# Patient Record
Sex: Female | Born: 1965 | Race: Black or African American | Hispanic: No | Marital: Single | Smoking: Never smoker
Health system: Southern US, Community
[De-identification: ages and names within clinical notes are randomized; demographics above are authoritative.]

## PROBLEM LIST (undated history)

## (undated) DIAGNOSIS — E119 Type 2 diabetes mellitus without complications: Secondary | ICD-10-CM

## (undated) HISTORY — PX: ABDOMINAL HYSTERECTOMY: SHX81

## (undated) HISTORY — PX: BACK SURGERY: SHX140

---

## 2004-02-03 ENCOUNTER — Other Ambulatory Visit: Payer: Self-pay

## 2005-07-06 ENCOUNTER — Emergency Department: Payer: Self-pay | Admitting: Emergency Medicine

## 2006-08-28 ENCOUNTER — Emergency Department: Payer: Self-pay | Admitting: Emergency Medicine

## 2007-05-26 ENCOUNTER — Emergency Department: Payer: Self-pay

## 2007-08-16 ENCOUNTER — Ambulatory Visit: Payer: Self-pay | Admitting: Unknown Physician Specialty

## 2007-08-18 ENCOUNTER — Ambulatory Visit: Payer: Self-pay | Admitting: Unknown Physician Specialty

## 2008-02-11 ENCOUNTER — Other Ambulatory Visit: Payer: Self-pay

## 2008-02-11 ENCOUNTER — Emergency Department: Payer: Self-pay | Admitting: Emergency Medicine

## 2009-11-21 ENCOUNTER — Ambulatory Visit: Payer: Self-pay | Admitting: Unknown Physician Specialty

## 2009-11-27 ENCOUNTER — Ambulatory Visit: Payer: Self-pay | Admitting: Unknown Physician Specialty

## 2010-12-04 ENCOUNTER — Emergency Department: Payer: Self-pay | Admitting: Emergency Medicine

## 2017-06-15 ENCOUNTER — Emergency Department: Payer: Managed Care, Other (non HMO)

## 2017-06-15 ENCOUNTER — Emergency Department
Admission: EM | Admit: 2017-06-15 | Discharge: 2017-06-15 | Disposition: A | Discharge status: Home / Self Care | Payer: Managed Care, Other (non HMO) | Attending: Emergency Medicine | Admitting: Emergency Medicine

## 2017-06-15 ENCOUNTER — Encounter: Payer: Self-pay | Admitting: Emergency Medicine

## 2017-06-15 DIAGNOSIS — R0789 Other chest pain: Secondary | ICD-10-CM | POA: Insufficient documentation

## 2017-06-15 DIAGNOSIS — M546 Pain in thoracic spine: Secondary | ICD-10-CM | POA: Diagnosis not present

## 2017-06-15 DIAGNOSIS — E1165 Type 2 diabetes mellitus with hyperglycemia: Secondary | ICD-10-CM | POA: Insufficient documentation

## 2017-06-15 DIAGNOSIS — R739 Hyperglycemia, unspecified: Secondary | ICD-10-CM

## 2017-06-15 DIAGNOSIS — R079 Chest pain, unspecified: Secondary | ICD-10-CM

## 2017-06-15 HISTORY — DX: Type 2 diabetes mellitus without complications: E11.9

## 2017-06-15 LAB — CBC
HEMATOCRIT: 42.8 % (ref 35.0–47.0)
Hemoglobin: 13.7 g/dL (ref 12.0–16.0)
MCH: 22.9 pg — ABNORMAL LOW (ref 26.0–34.0)
MCHC: 32 g/dL (ref 32.0–36.0)
MCV: 71.5 fL — AB (ref 80.0–100.0)
PLATELETS: 183 10*3/uL (ref 150–440)
RBC: 5.98 MIL/uL — ABNORMAL HIGH (ref 3.80–5.20)
RDW: 13.4 % (ref 11.5–14.5)
WBC: 7 10*3/uL (ref 3.6–11.0)

## 2017-06-15 LAB — BASIC METABOLIC PANEL
Anion gap: 6 (ref 5–15)
BUN: 13 mg/dL (ref 6–20)
CHLORIDE: 104 mmol/L (ref 101–111)
CO2: 26 mmol/L (ref 22–32)
CREATININE: 0.84 mg/dL (ref 0.44–1.00)
Calcium: 8.7 mg/dL — ABNORMAL LOW (ref 8.9–10.3)
GFR calc Af Amer: >60 mL/min (ref >60)
GFR calc non Af Amer: >60 mL/min (ref >60)
GLUCOSE: 280 mg/dL — AB (ref 65–99)
Potassium: 3.7 mmol/L (ref 3.5–5.1)
SODIUM: 136 mmol/L (ref 135–145)

## 2017-06-15 LAB — TROPONIN I: Troponin I: <0.03 ng/mL (ref <0.03)

## 2017-06-15 MED ORDER — ASPIRIN 81 MG PO CHEW
324.0000 mg | CHEWABLE_TABLET | Freq: Once | ORAL | Status: AC
  Administered 2017-06-15: 324 mg via ORAL
  Filled 2017-06-15: qty 4

## 2017-06-15 NOTE — ED Provider Notes (Signed)
[facility] Emergency Department Provider Note  ____________________________________________  Time seen: Approximately 8:05 AM  I have reviewed the triage vital signs and the nursing notes.   HISTORY  Chief Complaint Chest Pain    HPI Meghan Johnson is a 51 y.o. female with a history of DM who self discontinued her medication presenting with chest pain. The patient reports that yesterday around 2:30 in the afternoon she was watching television when she noticed a "pins and needles" sensation under the left breast. She felt this was probably gas, but it persisted throughout the evening and was still present this morning when she woke up. She did not have any associated lightheadedness or syncope, palpitations, shortness of breath, diaphoresis, nausea or vomiting. She did have some mild radiation to the back. Last stress test was in 2008.  SH: No tobacco or cocaine.   Past Medical History:  Diagnosis Date  . Diabetes mellitus without complication (HCC)     There are no active problems to display for this patient.   Past Surgical History:  Procedure Laterality Date  . ABDOMINAL HYSTERECTOMY    . BACK SURGERY        Allergies Patient has no known allergies.  No family history on file.  Social History Social History  Substance Use Topics  . Smoking status: Never Smoker  . Smokeless tobacco: Never Used  . Alcohol use No    Review of Systems Constitutional: No fever/chills.No lightheadedness or syncope. Eyes: No visual changes. ENT: No sore throat. No congestion or rhinorrhea. Cardiovascular: Positive left-sided chest pain. Denies palpitations. Respiratory: Denies shortness of breath.  No cough. Gastrointestinal: No abdominal pain.  No nausea, no vomiting.  No diarrhea.  No constipation. Genitourinary: Negative for dysuria. Musculoskeletal: + for back pain. Skin: Negative for rash. Neurological: Negative for headaches. No focal numbness,  tingling or weakness.     ____________________________________________   PHYSICAL EXAM:  VITAL SIGNS: ED Triage Vitals  Enc Vitals Group     BP 06/15/17 0741 (!) 151/65     Pulse Rate 06/15/17 0741 68     Resp 06/15/17 0741 18     Temp 06/15/17 0741 98.1 F (36.7 C)     Temp Source 06/15/17 0741 Oral     SpO2 06/15/17 0741 97 %     Weight 06/15/17 0736 216 lb (98 kg)     Height 06/15/17 0736 5\' 3"  (1.6 m)     Head Circumference --      Peak Flow --      Pain Score 06/15/17 0736 5     Pain Loc --      Pain Edu? --      Excl. in GC? --     Constitutional: Alert and oriented. Well appearing and in no acute distress. Answers questions appropriately. Eyes: Conjunctivae are normal.  EOMI. No scleral icterus. Head: Atraumatic. Nose: No congestion/rhinnorhea. Mouth/Throat: Mucous membranes are moist.  Neck: No stridor.  Supple.  JVD. No meningismus. Cardiovascular: Normal rate, regular rhythm. No murmurs, rubs or gallops. Pt is ttp below the L breast on my exam w/o any skin changes, bruising, swelling, rash or crepitus. Respiratory: Normal respiratory effort.  No accessory muscle use or retractions. Lungs CTAB.  No wheezes, rales or ronchi. Gastrointestinal: Soft, nontender and nondistended.  No guarding or rebound.  No peritoneal signs. Musculoskeletal: No LE edema. No ttp in the calves or palpable cords.  Negative Homan's sign. Neurologic:  A&Ox3.  Speech is clear.  Face and smile  are symmetric.  EOMI.  Moves all extremities well. Skin:  Skin is warm, dry and intact. No rash noted. Psychiatric: Mood and affect are normal. Speech and behavior are normal.  Normal judgement.  ____________________________________________   LABS (all labs ordered are listed, but only abnormal results are displayed)  Labs Reviewed  BASIC METABOLIC PANEL - Abnormal; Notable for the following:       Result Value   Glucose, Bld 280 (*)    Calcium 8.7 (*)    All other components within normal  limits  CBC - Abnormal; Notable for the following:    RBC 5.98 (*)    MCV 71.5 (*)    MCH 22.9 (*)    All other components within normal limits  TROPONIN I   ____________________________________________  EKG  ED ECG REPORT I, Rockne MenghiniNorman, Anne-Caroline, the attending physician, personally viewed and interpreted this ECG.   Date: 06/15/2017  EKG Time: 735  Rate: 65  Rhythm: normal sinus rhythm  Axis: leftward  Intervals:none  ST&T Change: No STEMI  ____________________________________________  RADIOLOGY  Dg Chest 2 View  Result Date: 06/15/2017 CLINICAL DATA:  Left-sided chest pain. EXAM: CHEST  2 VIEW COMPARISON:  02/11/2008. FINDINGS: Mediastinum hilar structures normal. Heart size normal. Low lung volumes. Mild infiltrate right mid lung field cannot be excluded. Right upper quadrant calcific density, possibly representing a gallstone. IMPRESSION: 1. Low lung volumes. Mild infiltrate right mid lung cannot be excluded. 2. Right upper quadrant calcific density, possibly representing a gallstone. Electronically Signed   By: Maisie Fushomas  Register   On: 06/15/2017 08:23    ____________________________________________   PROCEDURES  Procedure(s) performed: None  Procedures  Critical Care performed: No ____________________________________________   INITIAL IMPRESSION / ASSESSMENT AND PLAN / ED COURSE  Pertinent labs & imaging results that were available during my care of the patient were reviewed by me and considered in my medical decision making (see chart for details).  51 y.o. with a history of noncompliance, DM, presenting with pins and needle sensation under the left breast and radiated to the back which has been constant since yesterday at 2:30 PM. Overall, this is an atypical chest pain and the patient has a reassuring cardiopulmonary exam as well as EKG and vital signs. We'll get basic labs including troponin, and a single value should be sufficient given that the onset was  at 2:30 PM yesterday. The patient does have some reproducible pain, and I wonder if this may be musculoskeletal in nature. However, given her risk factors, it is reasonable for her to undergo a risk stratification study with cardiology as an outpatient.  Aortic pathology or PE is very unlikely. If the patient's workup in the emergency department is reassuring, I'll plan to discharge her home with cardiology follow-up and will talk to the cardiologist on-call. Plan reevaluation for final disposition.  ----------------------------------------- 9:27 AM on 06/15/2017 -----------------------------------------  At this time, the patient is safe for discharge. Her EKG does not show ischemic changes, her chest x-ray does not show any acute cardiopulmonary process, and her troponin is negative. She has no other significantly abnormal findings other than hyperglycemia without DKA. I have encouraged her to make appointment with her primary care physician to reevaluate the use of medications for her diabetes. The patient will make an appointment with cardiology for reevaluation and a risk stratification study. Return precautions as well as follow-up instructions were discussed.  ____________________________________________  FINAL CLINICAL IMPRESSION(S) / ED DIAGNOSES  Final diagnoses:  Chest pain, unspecified type  Acute  thoracic back pain, unspecified back pain laterality  Hyperglycemia         NEW MEDICATIONS STARTED DURING THIS VISIT:  New Prescriptions   No medications on file      Rockne Menghini, MD 06/15/17 [id number]

## 2017-06-15 NOTE — ED Triage Notes (Signed)
Pt reports left side chest pain under her breast that radiates to her left shoulder and back. Pt reports the pain feels like needles. Pt in no apparent distress in triage.

## 2017-06-15 NOTE — Discharge Instructions (Signed)
Please make an appointment to be seen in the cardiology clinic for re-evaluation and for possible cardiac stress test.  Please make an appointment with your primary care physician, or [facility], to be reevaluated for diabetes medications. Today, your blood sugar was very high, and having elevated blood sugars can lead to many other significant health problems  Return to the emergency department if you develop worsening chest pain, shortness of breath, nausea or vomiting, cold or clammy feeling, lightheadedness or fainting, palpitations, or any other symptoms concerning to you.

## 2018-01-03 IMAGING — CR DG CHEST 2V
1 series · 2 of 2 positions shown · non-contrast
Comparison: 02/11/2008.

CLINICAL DATA: Left-sided chest pain.

EXAM:
CHEST  2 VIEW

[Series 1: dg chest 2 view · 0.14mm/px · 2 of 2 slices shown]
[im 1/2]
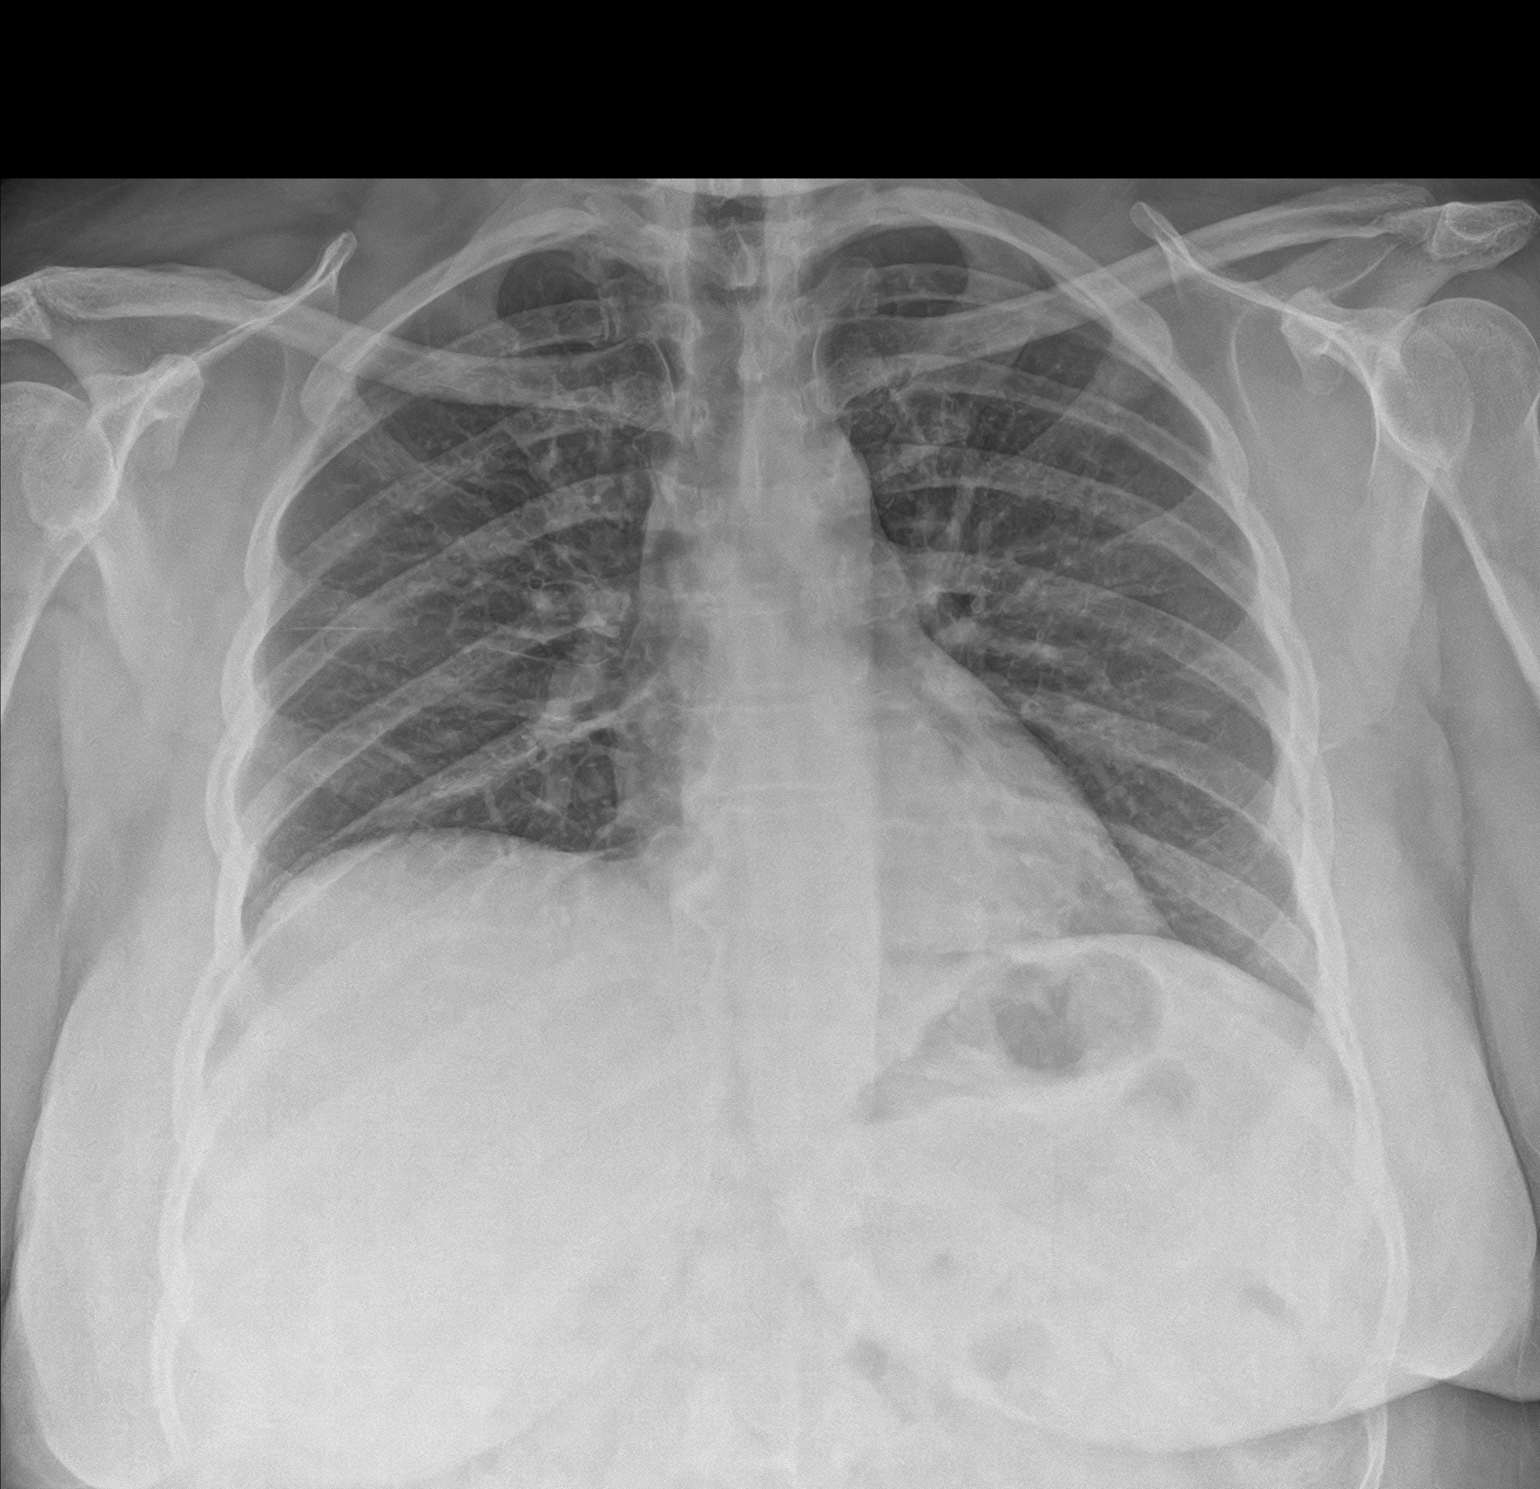
[im 2/2]
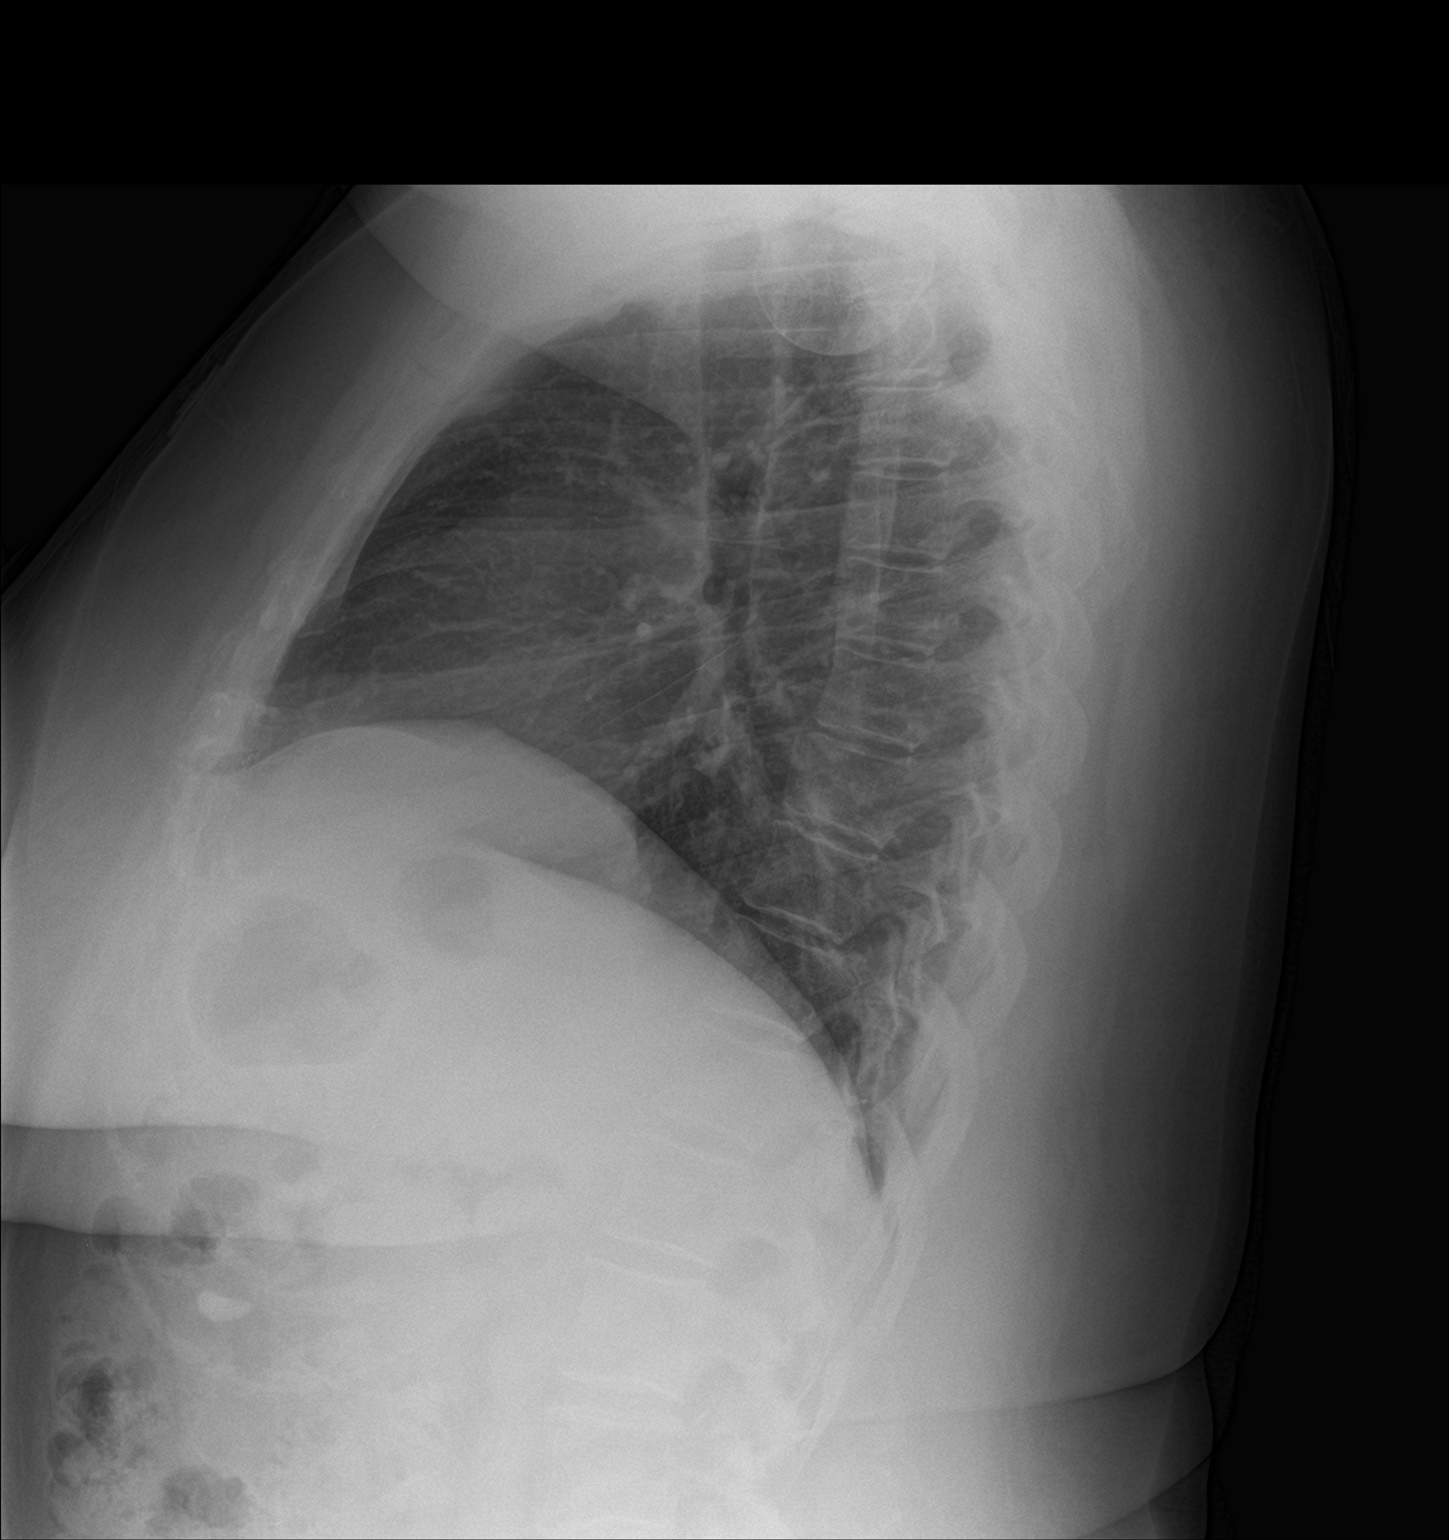

[2 of 2 positions shown; findings below may reference images not displayed]

FINDINGS: Mediastinum hilar structures normal. Heart size normal. Low lung
volumes. Mild infiltrate right mid lung field cannot be excluded.
Right upper quadrant calcific density, possibly representing a
gallstone.
IMPRESSION: 1. Low lung volumes. Mild infiltrate right mid lung cannot be
excluded.

2. Right upper quadrant calcific density, possibly representing a
gallstone.

## 2019-01-25 ENCOUNTER — Telehealth: Payer: Self-pay | Admitting: Nurse Practitioner

## 2019-01-25 NOTE — Telephone Encounter (Signed)
RCVD REQ FOR RX FROM [facility] ATT TO CONTACT PT TO SCHEDULE APPT SO RX CAN BE FILLED NO ANS LVM TO CALL OFC

## 2019-05-25 ENCOUNTER — Telehealth: Payer: Self-pay

## 2019-05-25 NOTE — Telephone Encounter (Signed)
I called pt to see if she was still coming here because pharmacy sent over a refill request. Left v/m to call office. YRL,RMA

## 2020-06-12 ENCOUNTER — Other Ambulatory Visit: Payer: Self-pay | Admitting: Family Medicine

## 2020-06-12 DIAGNOSIS — Z1231 Encounter for screening mammogram for malignant neoplasm of breast: Secondary | ICD-10-CM

## 2021-04-15 ENCOUNTER — Other Ambulatory Visit: Payer: Self-pay

## 2021-08-29 ENCOUNTER — Encounter: Payer: Self-pay | Admitting: Emergency Medicine

## 2021-08-29 ENCOUNTER — Emergency Department
Admission: EM | Admit: 2021-08-29 | Discharge: 2021-08-29 | Disposition: A | Discharge status: Home / Self Care | Payer: Commercial Managed Care - PPO | Attending: Emergency Medicine | Admitting: Emergency Medicine

## 2021-08-29 ENCOUNTER — Emergency Department: Payer: Commercial Managed Care - PPO

## 2021-08-29 ENCOUNTER — Other Ambulatory Visit: Payer: Self-pay

## 2021-08-29 DIAGNOSIS — E119 Type 2 diabetes mellitus without complications: Secondary | ICD-10-CM | POA: Insufficient documentation

## 2021-08-29 DIAGNOSIS — Z79899 Other long term (current) drug therapy: Secondary | ICD-10-CM | POA: Insufficient documentation

## 2021-08-29 DIAGNOSIS — Z7984 Long term (current) use of oral hypoglycemic drugs: Secondary | ICD-10-CM | POA: Diagnosis not present

## 2021-08-29 DIAGNOSIS — R2242 Localized swelling, mass and lump, left lower limb: Secondary | ICD-10-CM | POA: Insufficient documentation

## 2021-08-29 DIAGNOSIS — R609 Edema, unspecified: Secondary | ICD-10-CM

## 2021-08-29 LAB — BASIC METABOLIC PANEL
Anion gap: 8 (ref 5–15)
BUN: 14 mg/dL (ref 6–20)
CO2: 25 mmol/L (ref 22–32)
Calcium: 9.2 mg/dL (ref 8.9–10.3)
Chloride: 103 mmol/L (ref 98–111)
Creatinine, Ser: 0.83 mg/dL (ref 0.44–1.00)
GFR, Estimated: >60 mL/min (ref >60)
Glucose, Bld: 306 mg/dL — ABNORMAL HIGH (ref 70–99)
Potassium: 3.7 mmol/L (ref 3.5–5.1)
Sodium: 136 mmol/L (ref 135–145)

## 2021-08-29 LAB — CBC WITH DIFFERENTIAL/PLATELET
Abs Immature Granulocytes: 0.02 10*3/uL (ref 0.00–0.07)
Basophils Absolute: 0 10*3/uL (ref 0.0–0.1)
Basophils Relative: 0 %
Eosinophils Absolute: 0.1 10*3/uL (ref 0.0–0.5)
Eosinophils Relative: 1 %
HCT: 41.8 % (ref 36.0–46.0)
Hemoglobin: 13.1 g/dL (ref 12.0–15.0)
Immature Granulocytes: 0 %
Lymphocytes Relative: 40 %
Lymphs Abs: 2.9 10*3/uL (ref 0.7–4.0)
MCH: 23.2 pg — ABNORMAL LOW (ref 26.0–34.0)
MCHC: 31.3 g/dL (ref 30.0–36.0)
MCV: 74 fL — ABNORMAL LOW (ref 80.0–100.0)
Monocytes Absolute: 0.4 10*3/uL (ref 0.1–1.0)
Monocytes Relative: 5 %
Neutro Abs: 3.9 10*3/uL (ref 1.7–7.7)
Neutrophils Relative %: 54 %
Platelets: 199 10*3/uL (ref 150–400)
RBC: 5.65 MIL/uL — ABNORMAL HIGH (ref 3.87–5.11)
RDW: 13.8 % (ref 11.5–15.5)
WBC: 7.2 10*3/uL (ref 4.0–10.5)
nRBC: 0 % (ref 0.0–0.2)

## 2021-08-29 MED ORDER — IBUPROFEN 400 MG PO TABS: 400.0000 mg | ORAL_TABLET | Freq: Three times a day (TID) | ORAL | 0 refills | Status: AC | PRN

## 2021-08-29 NOTE — ED Triage Notes (Signed)
Pt comes into the ED via POV c/o left leg swelling from the foot to the knee.  Pt states the swelling started about 4 days ago.  Pt has soreness to the touch, but denies any injury.  Pt in NAD at this time with even and unlabored respirations

## 2021-08-29 NOTE — ED Provider Notes (Signed)
[facility]  ____________________________________________   Event Date/Time   First MD Initiated Contact with Patient 08/29/21 1931     (approximate)  I have reviewed the triage vital signs and the nursing notes.   HISTORY  Chief Complaint Leg Swelling    HPI Melissa NALLA PURDY is a 55 y.o. female with past medical history of diabetes who presents with left leg swelling.  Onset was 4 days ago.  She denies any acute injury.  She denies prior history of DVT/PE.  She denies any shortness of breath or chest pain.  She denies fevers or chills.  Has not been taking anything for it.  She was told she should be evaluated to make sure she does not have a blood clot.  She has associated pain and tightness with the swelling.         Past Medical History:  Diagnosis Date   Diabetes mellitus without complication (HCC)     There are no problems to display for this patient.   Past Surgical History:  Procedure Laterality Date   ABDOMINAL HYSTERECTOMY     BACK SURGERY      Prior to Admission medications   Medication Sig Start Date End Date Taking? Authorizing Provider  ibuprofen (ADVIL) 400 MG tablet Take 1 tablet (400 mg total) by mouth every 8 (eight) hours as needed for up to 10 days. 08/29/21 09/08/21 Yes Georga Hacking, MD  atorvastatin (LIPITOR) 10 MG tablet  12/03/18   [provider]  dapagliflozin propanediol (FARXIGA) 5 MG TABS tablet  12/03/18   [provider]  lisinopril (ZESTRIL) 2.5 MG tablet  12/03/18   [provider]  metFORMIN (GLUCOPHAGE) 500 MG tablet Take by mouth.    [provider]  prednisoLONE acetate (PRED FORTE) 1 % ophthalmic suspension Administer 1 drop to the right eye Four (4) times a day. 4x/day x 2 days, 3x/day for 2 days, 2x/day for 2 days, 1xday for 2 days then stop 12/27/18   [provider]    Allergies Patient has no known allergies.  History reviewed. No pertinent family  history.  Social History Social History   Tobacco Use   Smoking status: Never   Smokeless tobacco: Never  Substance Use Topics   Alcohol use: No   Drug use: No    Review of Systems   Review of Systems  Constitutional:  Negative for chills and fever.  Respiratory:  Negative for shortness of breath.   Cardiovascular:  Positive for leg swelling. Negative for chest pain.  All other systems reviewed and are negative.  Physical Exam Updated Vital Signs BP (!) 142/91 (BP Location: Left Arm)   Pulse 75   Temp 98.7 F (37.1 C) (Oral)   Resp 16   Ht 5\' 3"  (1.6 m)   Wt 98 kg   SpO2 100%   BMI 38.27 kg/m   Physical Exam Vitals and nursing note reviewed.  Constitutional:      General: She is not in acute distress.    Appearance: Normal appearance.  HENT:     Head: Normocephalic and atraumatic.  Eyes:     General: No scleral icterus.    Conjunctiva/sclera: Conjunctivae normal.  Pulmonary:     Effort: Pulmonary effort is normal. No respiratory distress.     Breath sounds: No stridor.  Musculoskeletal:        General: No deformity or signs of injury.     Cervical back: Normal range of motion.  Left lower leg: Edema present.     Comments: Left lower extremity with nonpitting edema, mildly tender to palpation, no erythema or crepitus, No wound or rashes Foot is warm and well-perfused with good cap refill  Skin:    General: Skin is dry.     Coloration: Skin is not jaundiced or pale.  Neurological:     General: No focal deficit present.     Mental Status: She is alert and oriented to person, place, and time. Mental status is at baseline.  Psychiatric:        Mood and Affect: Mood normal.        Behavior: Behavior normal.     LABS (all labs ordered are listed, but only abnormal results are displayed)  Labs Reviewed  CBC WITH DIFFERENTIAL/PLATELET - Abnormal; Notable for the following components:      Result Value   RBC 5.65 (*)    MCV 74.0 (*)    MCH 23.2 (*)     All other components within normal limits  BASIC METABOLIC PANEL - Abnormal; Notable for the following components:   Glucose, Bld 306 (*)    All other components within normal limits  CBG MONITORING, ED   ____________________________________________  EKG  N/a ____________________________________________  RADIOLOGY Ky Barban, personally viewed and evaluated these images (plain radiographs) as part of my medical decision making, as well as reviewing the written report by the radiologist.  ED MD interpretation: I reviewed the DVT study which does not show any acute DVT    ____________________________________________   PROCEDURES  Procedure(s) performed (including Critical Care):  Procedures   ____________________________________________   INITIAL IMPRESSION / ASSESSMENT AND PLAN / ED COURSE     Patient is a 55 year old female who presents with atraumatic left lower extremity swelling.  On exam there is no signs of compartment syndrome or infection.  And her perfusion is normal.  I obtained a DVT ultrasound to rule out clot which is negative.  I advised the patient that if her symptoms do not improve she should have a repeat lower extremity ultrasound in about a week.  Also advised her to elevate leg, compression stockings and use NSAIDs.  PCP follow-up.      ____________________________________________   FINAL CLINICAL IMPRESSION(S) / ED DIAGNOSES  Final diagnoses:  Swelling     ED Discharge Orders          Ordered    ibuprofen (ADVIL) 400 MG tablet  Every 8 hours PRN        08/29/21 1952             Note:  This document was prepared using Dragon voice recognition software and may include unintentional dictation errors.    Georga Hacking, MD 08/29/21 2006

## 2021-08-29 NOTE — ED Provider Notes (Signed)
Emergency Medicine Provider OB Triage Evaluation Note  Meghan Johnson is a 55 y.o. female, No obstetric history on file., at Unknown gestation who presents to the emergency department with complaints of  L foot and lower extremity swelling x 4 days.   Review of  Systems  Positive: left lower extremity swelling, pain Negative: fever, chills, CP, SOB  Physical Exam  There were no vitals taken for this visit. General: Awake, no distress  HEENT: Atraumatic  Resp: Normal effort  Cardiac: Normal rate Abd: Nondistended, nontender  MSK: +LLE swelling, mild ttp Neuro: Speech clear  Medical Decision Making  55 yo female p/w 4 days of LLE swelling. Exam not c/w cellulitis. No trauma. Will get DVT study to r/o DVT. Basic labs in case needing DOAC.   Clinical Impression  Left lower extremity swelling     Georga Hacking, MD 08/29/21 [phone]

## 2021-08-29 NOTE — Discharge Instructions (Addendum)
Your ultrasound did not show any blood clot in the leg.  Please elevate the leg and you can take ibuprofen for pain.  Also try compression stockings.  If the swelling has not improved in the next week, please either follow-up with your primary care provider for repeat ultrasound or return to the emergency department.

## 2021-09-25 ENCOUNTER — Other Ambulatory Visit: Payer: Self-pay | Admitting: Family Medicine

## 2021-09-25 DIAGNOSIS — Z1231 Encounter for screening mammogram for malignant neoplasm of breast: Secondary | ICD-10-CM

## 2022-11-27 ENCOUNTER — Other Ambulatory Visit (HOSPITAL_COMMUNITY): Payer: Self-pay

## 2022-11-27 ENCOUNTER — Encounter (HOSPITAL_COMMUNITY): Payer: Self-pay

## 2022-11-27 ENCOUNTER — Ambulatory Visit (INDEPENDENT_AMBULATORY_CARE_PROVIDER_SITE_OTHER): Payer: No Typology Code available for payment source

## 2022-11-27 ENCOUNTER — Ambulatory Visit (HOSPITAL_COMMUNITY)
Admission: EM | Admit: 2022-11-27 | Discharge: 2022-11-27 | Disposition: A | Discharge status: Home / Self Care | Payer: No Typology Code available for payment source | Attending: Sports Medicine | Admitting: Sports Medicine

## 2022-11-27 DIAGNOSIS — M79671 Pain in right foot: Secondary | ICD-10-CM | POA: Diagnosis not present

## 2022-11-27 DIAGNOSIS — M25571 Pain in right ankle and joints of right foot: Secondary | ICD-10-CM

## 2022-11-27 MED ORDER — PREDNISONE 5 MG (21) PO TBPK: 5.0000 mg | ORAL_TABLET | Freq: Four times a day (QID) | ORAL | Status: DC

## 2022-11-27 MED ORDER — PREDNISONE 10 MG (21) PO TBPK
ORAL_TABLET | ORAL | 0 refills | Status: DC
  Filled 2022-11-27: qty 21, 6d supply, fill #0

## 2022-11-27 MED ORDER — PREDNISONE 5 MG (21) PO TBPK: 10.0000 mg | ORAL_TABLET | Freq: Every evening | ORAL | Status: DC

## 2022-11-27 MED ORDER — PREDNISONE 5 MG (21) PO TBPK: 10.0000 mg | ORAL_TABLET | Freq: Every morning | ORAL | Status: DC

## 2022-11-27 MED ORDER — PREDNISONE 5 MG (21) PO TBPK: 5.0000 mg | ORAL_TABLET | ORAL | Status: DC

## 2022-11-27 MED ORDER — PREDNISONE 10 MG (21) PO TBPK
ORAL_TABLET | ORAL | 0 refills | Status: DC
  Filled 2022-11-27: qty 1, fill #0

## 2022-11-27 MED ORDER — PREDNISONE 5 MG (21) PO TBPK: 5.0000 mg | ORAL_TABLET | Freq: Three times a day (TID) | ORAL | Status: DC

## 2022-11-27 NOTE — ED Provider Notes (Signed)
[facility]    CSN: [id number] Arrival date & time: 11/27/22  3151      History   Chief Complaint No chief complaint on file.   HPI Meghan Johnson is a 56 y.o. female.   Ms. Knouff presents today with chief complaint of right foot swelling.  She reports this is been off and on for the past 4 days but was worse this morning.  Typically the swelling goes down by the end of the day.  She reports she was unable to ambulate and go to work today as this is causing her to limp and she works at [employer] on her feet.  She denies any injury or trauma to her foot.  Most of her pain is located on the top of her foot near her ankle.  She denies any history of gout.  She has had some right leg swelling in the past that was alleviated with furosemide.  She has tried heat, ice, ibuprofen and a Salonpas patch that gave her no relief.  Foot is very tender she reports even touching the sheets and putting a sock on caused her some discomfort.     Past Medical History:  Diagnosis Date   Diabetes mellitus without complication (HCC)     There are no problems to display for this patient.   Past Surgical History:  Procedure Laterality Date   ABDOMINAL HYSTERECTOMY     BACK SURGERY      OB History   No obstetric history on file.      Home Medications    Prior to Admission medications   Medication Sig Start Date End Date Taking? Authorizing Provider  predniSONE (STERAPRED UNI-PAK 21 TAB) 10 MG (21) TBPK tablet Take as directed on box instructions 11/27/22  Yes Gillermo Murdoch A, DO  atorvastatin (LIPITOR) 10 MG tablet  12/03/18   [provider]  dapagliflozin propanediol (FARXIGA) 5 MG TABS tablet  12/03/18   [provider]  lisinopril (ZESTRIL) 2.5 MG tablet  12/03/18   [provider]  metFORMIN (GLUCOPHAGE) 500 MG tablet Take by mouth.    [provider]  prednisoLONE acetate (PRED FORTE) 1 % ophthalmic suspension Administer 1 drop to the  right eye Four (4) times a day. 4x/day x 2 days, 3x/day for 2 days, 2x/day for 2 days, 1xday for 2 days then stop 12/27/18   [provider]    Family History History reviewed. No pertinent family history.  Social History Social History   Tobacco Use   Smoking status: Never   Smokeless tobacco: Never  Substance Use Topics   Alcohol use: No   Drug use: No     Allergies   Patient has no known allergies.   Review of Systems Review of Systems as listed above in HPI   Physical Exam Triage Vital Signs ED Triage Vitals  Enc Vitals Group     BP 11/27/22 0851 (!) 137/98     Pulse Rate 11/27/22 0851 76     Resp 11/27/22 0851 16     Temp 11/27/22 0851 98.3 F (36.8 C)     Temp Source 11/27/22 0851 Oral     SpO2 11/27/22 0851 98 %     Weight --      Height --      Head Circumference --      Peak Flow --      Pain Score 11/27/22 0852 4     Pain Loc --  Pain Edu? --      Excl. in GC? --    No data found.  Updated Vital Signs BP (!) 137/98 (BP Location: Left Arm)   Pulse 76   Temp 98.3 F (36.8 C) (Oral)   Resp 16   SpO2 98%   Physical Exam Vitals reviewed.  Constitutional:      General: She is not in acute distress.    Appearance: Normal appearance. She is not toxic-appearing.  HENT:     Head: Normocephalic.  Cardiovascular:     Rate and Rhythm: Normal rate.  Pulmonary:     Effort: Pulmonary effort is normal.  Musculoskeletal:        General: Swelling and tenderness present. No signs of injury. Normal range of motion.     Comments: Right foot: No obvious deformity.  No ecchymosis or lesion.  She does have some swelling, warmth and erythema over the dorsal surface of her right foot. Tenderness to palpation at the base of second third fourth and fifth metatarsals as well as anterior ankle.  Full range of motion with plantarflexion, dorsiflexion inversion and eversion.  Strength 5/5 at the ankle.  Capillary refill 2 seconds.  Antalgic gait  Skin:     General: Skin is warm.     Findings: Erythema present. No bruising, lesion or rash.  Neurological:     Mental Status: She is alert.     UC Treatments / Results  Labs (all labs ordered are listed, but only abnormal results are displayed) Labs Reviewed - No data to display  EKG  Radiology DG Foot Complete Right  Result Date: 11/27/2022 CLINICAL DATA:  swelling and pain EXAM: RIGHT FOOT COMPLETE - 3 VIEW; RIGHT ANKLE - COMPLETE 3 VIEW COMPARISON:  None Available. FINDINGS: There is no evidence of fracture, dislocation, or joint effusion. There is no evidence of arthropathy or other acute focal bone abnormality. There is a large plantar calcaneal spur. Soft tissue swelling noted at the ankle mortise and overlying the distal metatarsals. IMPRESSION: Soft tissue swelling. Calcaneal plantar spur. No acute osseous abnormality identified. Electronically Signed   By: Layla Maw M.D.   On: 11/27/2022 09:27   DG Ankle Complete Right  Result Date: 11/27/2022 CLINICAL DATA:  swelling and pain EXAM: RIGHT FOOT COMPLETE - 3 VIEW; RIGHT ANKLE - COMPLETE 3 VIEW COMPARISON:  None Available. FINDINGS: There is no evidence of fracture, dislocation, or joint effusion. There is no evidence of arthropathy or other acute focal bone abnormality. There is a large plantar calcaneal spur. Soft tissue swelling noted at the ankle mortise and overlying the distal metatarsals. IMPRESSION: Soft tissue swelling. Calcaneal plantar spur. No acute osseous abnormality identified. Electronically Signed   By: Layla Maw M.D.   On: 11/27/2022 09:27    Procedures Procedures (including critical care time)  Medications Ordered in UC Medications  predniSONE (STERAPRED UNI-PAK 21 TAB) tablet 10 mg (has no administration in time range)  predniSONE (STERAPRED UNI-PAK 21 TAB) tablet 5 mg (has no administration in time range)  predniSONE (STERAPRED UNI-PAK 21 TAB) tablet 5 mg (has no administration in time range)   predniSONE (STERAPRED UNI-PAK 21 TAB) tablet 10 mg (has no administration in time range)  predniSONE (STERAPRED UNI-PAK 21 TAB) tablet 5 mg (has no administration in time range)  predniSONE (STERAPRED UNI-PAK 21 TAB) tablet 10 mg (has no administration in time range)    Initial Impression / Assessment and Plan / UC Course  I have reviewed the triage vital signs and  the nursing notes.  Pertinent labs & imaging results that were available during my care of the patient were reviewed by me and considered in my medical decision making (see chart for details).     Right foot pain and swelling, x-rays ordered of the foot and ankle today, no obvious fracture or abnormality.  There is some soft tissue swelling noted over the ankle mortise and overlying the distal metatarsals.  I suspect this could be secondary to gout.  Steroid taper sent to her pharmacy.  She was counseled on how this may increase her blood sugars and she will need to keep an eye on this.  Return to urgent care or see your primary care provider if the symptoms worsen or fail to improve.  If she has no improvement she may need oral antibiotics as this could be cellulitis however less likely based on clinical evaluation today.  I do recommend follow-up with primary care provider early next week.  She verbalized understanding. Final Clinical Impressions(s) / UC Diagnoses   Final diagnoses:  Right foot pain     Discharge Instructions      For your right foot pain and soft tissue swelling anticipate this could be secondary to gout.  You may wear the postop shoe as needed for comfort. I have sent a prednisone taper to your pharmacy.  This will increase your blood sugar so I do recommend you keep an eye on them.  Contact your primary care provider today and schedule a follow-up appointment for early next week.  If this does not improve or worsens return to the urgent care or see your primary care provider soon as possible.     ED  Prescriptions     Medication Sig Dispense Auth. Provider   predniSONE (STERAPRED UNI-PAK 21 TAB) 10 MG (21) TBPK tablet Take as directed on box instructions 1 each Gillermo Murdoch A, DO      PDMP not reviewed this encounter.   Gillermo Murdoch A, DO 11/27/22 1008

## 2022-11-27 NOTE — ED Triage Notes (Signed)
Pt reports waking up this morning with right foot pain and swelling. Denies any trauma at this time.

## 2022-11-27 NOTE — Discharge Instructions (Addendum)
For your right foot pain and soft tissue swelling anticipate this could be secondary to gout.  You may wear the postop shoe as needed for comfort. I have sent a prednisone taper to your pharmacy.  This will increase your blood sugar so I do recommend you keep an eye on them.  Contact your primary care provider today and schedule a follow-up appointment for early next week.  If this does not improve or worsens return to the urgent care or see your primary care provider soon as possible.

## 2023-12-02 ENCOUNTER — Other Ambulatory Visit (HOSPITAL_COMMUNITY): Payer: Self-pay

## 2023-12-02 ENCOUNTER — Encounter (HOSPITAL_COMMUNITY): Payer: Self-pay

## 2023-12-02 ENCOUNTER — Ambulatory Visit (HOSPITAL_COMMUNITY)
Admission: EM | Admit: 2023-12-02 | Discharge: 2023-12-02 | Disposition: A | Discharge status: Home / Self Care | Payer: 59 | Attending: Sports Medicine | Admitting: Sports Medicine

## 2023-12-02 DIAGNOSIS — I10 Essential (primary) hypertension: Secondary | ICD-10-CM

## 2023-12-02 DIAGNOSIS — H66001 Acute suppurative otitis media without spontaneous rupture of ear drum, right ear: Secondary | ICD-10-CM

## 2023-12-02 DIAGNOSIS — E119 Type 2 diabetes mellitus without complications: Secondary | ICD-10-CM

## 2023-12-02 LAB — POCT FASTING CBG KUC MANUAL ENTRY: POCT Glucose (KUC): 365 mg/dL — AB (ref 70–99)

## 2023-12-02 MED ORDER — DAPAGLIFLOZIN PROPANEDIOL 5 MG PO TABS
5.0000 mg | ORAL_TABLET | Freq: Every day | ORAL | 0 refills | Status: AC
  Filled 2023-12-02: qty 30, 30d supply, fill #0

## 2023-12-02 MED ORDER — AMOXICILLIN 875 MG PO TABS
875.0000 mg | ORAL_TABLET | Freq: Two times a day (BID) | ORAL | 0 refills | Status: AC
  Filled 2023-12-02: qty 10, 5d supply, fill #0

## 2023-12-02 MED ORDER — LISINOPRIL 2.5 MG PO TABS
2.5000 mg | ORAL_TABLET | Freq: Every day | ORAL | 0 refills | Status: AC
  Filled 2023-12-02: qty 30, 30d supply, fill #0

## 2023-12-02 MED ORDER — ATORVASTATIN CALCIUM 10 MG PO TABS
10.0000 mg | ORAL_TABLET | Freq: Every day | ORAL | 0 refills | Status: AC
  Filled 2023-12-02: qty 30, 30d supply, fill #0

## 2023-12-02 NOTE — ED Triage Notes (Signed)
Pt presents with right ear pain x 2 to 3 days. Pt reports taking dual-action Tylenol last night with little improvement in pain, ear ache still present. Pt currently rates her pain a 7/10.   Pt also states "I am having issues with diabetes. I know my sugars are high, I have not checked them. I have not taken my medication in at least six months." Pt reports no other symptoms at this time.

## 2023-12-02 NOTE — Discharge Instructions (Signed)
You have an ear infection in your right ear. I have sent you 5 days of Amoxicillin to take twice daily. Complete the full course of these antibiotics.  We also discussed resuming your medications for your blood pressure and diabetes. I have refilled the Farxiga, Lisinopril, and Atorvastatin. It is important you schedule a follow-up with your previous or new primary care doctor for long term management of your diabetes and dose modification/medication changes as we discussed.

## 2023-12-02 NOTE — ED Provider Notes (Signed)
[facility]    CSN: [id number] Arrival date & time: 12/02/23  0802      History   Chief Complaint Chief Complaint  Patient presents with   Diabetes   Otalgia    HPI Meghan Johnson is a 57 y.o. female.   Meghan Johnson is a 57yo female with history of T2DM and HTN here with CC of right ear pain of 3 day duration. She also wants to discuss her T2DM and medications.  She notes that she has had 3 days of right ear pain and fullness.  She denies fevers, chills, upper respiratory tract infection symptoms.  She has a history of ear infections though has not had one in a few years.  She has no allergies to antibiotics that she endorses.  Regarding her diabetes, the patient states that she has not been taking any of her medications for the past 6 months.  She states that she was previously tried on metformin though was unable to tolerate this secondary to persistent diarrhea.  She states that her primary care doctor put her on Ozempic and she tolerated this well though due to cost reason she was transitioned from [facility] to Liraglutide though is not able to be providing herself injections daily. She would like to return back on Semaglutide. She is also looking to find a new PCP as her current provider is too far away in [place]. She is a Producer, television/film/video.   Diabetes  Otalgia   Past Medical History:  Diagnosis Date   Diabetes mellitus without complication (HCC)     There are no problems to display for this patient.   Past Surgical History:  Procedure Laterality Date   ABDOMINAL HYSTERECTOMY     BACK SURGERY      OB History   No obstetric history on file.      Home Medications    Prior to Admission medications   Medication Sig Start Date End Date Taking? Authorizing Provider  amoxicillin (AMOXIL) 875 MG tablet Take 1 tablet (875 mg total) by mouth 2 (two) times daily for 5 days. 12/02/23 12/07/23 Yes Marisa Cyphers, MD  atorvastatin (LIPITOR) 10 MG tablet  Take 1 tablet (10 mg total) by mouth daily. 12/02/23   Marisa Cyphers, MD  dapagliflozin propanediol (FARXIGA) 5 MG TABS tablet Take 1 tablet (5 mg total) by mouth daily. 12/02/23   Marisa Cyphers, MD  lisinopril (ZESTRIL) 2.5 MG tablet Take 1 tablet (2.5 mg total) by mouth daily. 12/02/23   Marisa Cyphers, MD    Family History History reviewed. No pertinent family history.  Social History Social History   Tobacco Use   Smoking status: Never   Smokeless tobacco: Never  Vaping Use   Vaping status: Never Used  Substance Use Topics   Alcohol use: No   Drug use: No     Allergies   Patient has no known allergies.   Review of Systems Review of Systems  HENT:  Positive for ear pain.      Physical Exam Triage Vital Signs ED Triage Vitals  Encounter Vitals Group     BP 12/02/23 0828 (!) 155/66     Systolic BP Percentile --      Diastolic BP Percentile --      Pulse Rate 12/02/23 0828 76     Resp 12/02/23 0828 16     Temp 12/02/23 0828 97.6 F (36.4 C)     Temp Source 12/02/23 0828 Oral  SpO2 12/02/23 0828 97 %     Weight 12/02/23 0824 198 lb (89.8 kg)     Height 12/02/23 0824 5\' 3"  (1.6 m)     Head Circumference --      Peak Flow --      Pain Score 12/02/23 0824 7     Pain Loc --      Pain Education --      Exclude from Growth Chart --    No data found.  Updated Vital Signs BP (!) 155/66 (BP Location: Left Arm)   Pulse 76   Temp 97.6 F (36.4 C) (Oral)   Resp 16   Ht 5\' 3"  (1.6 m)   Wt 89.8 kg   SpO2 97%   BMI 35.07 kg/m   Visual Acuity Right Eye Distance:   Left Eye Distance:   Bilateral Distance:    Right Eye Near:   Left Eye Near:    Bilateral Near:     Physical Exam HENT:     Head: Normocephalic and atraumatic.     Right Ear: Hearing, ear canal and external ear normal. A middle ear effusion is present. Tympanic membrane is erythematous and bulging.     Left Ear: Hearing, tympanic membrane and ear canal normal.     Ears:      Comments: Purulence noted in right middle ear.     UC Treatments / Results  Labs (all labs ordered are listed, but only abnormal results are displayed) Labs Reviewed  POCT FASTING CBG [facility] MANUAL ENTRY - Abnormal; Notable for the following components:      Result Value   POCT Glucose ([facility]) 365 (*)    All other components within normal limits    EKG   Radiology No results found.  Procedures Procedures (including critical care time)  Medications Ordered in UC Medications - No data to display  Initial Impression / Assessment and Plan / UC Course  I have reviewed the triage vital signs and the nursing notes.  Pertinent labs & imaging results that were available during my care of the patient were reviewed by me and considered in my medical decision making (see chart for details).    Vitals and triage reviewed, patient is hemodynamically stable.  She has AOM of the right ear without complication. I have sent her 5d of Amoxicillin and advised she complete the full course. Recommended continuation of OTC medications for pain.  Regarding her T2DM, her blood sugar is elevated today at 365. I also advised she schedule a visit with her PCP or find a new PCP for longterm management of her T2DM and Hypertension. I did refill her Lisinopril, Farxiga, and Atorvastatin, however deferring to her PCP regarding GLP1 prescription and management. She expressed understanding and will schedule follow-up with PCP.   Final Clinical Impressions(s) / UC Diagnoses   Final diagnoses:  Non-recurrent acute suppurative otitis media of right ear without spontaneous rupture of tympanic membrane  Type 2 diabetes mellitus without complication, without long-term current use of insulin (HCC)  Essential hypertension     Discharge Instructions      You have an ear infection in your right ear. I have sent you 5 days of Amoxicillin to take twice daily. Complete the full course of these antibiotics.  We also  discussed resuming your medications for your blood pressure and diabetes. I have refilled the Farxiga, Lisinopril, and Atorvastatin. It is important you schedule a follow-up with your previous or new primary care doctor for long term  management of your diabetes and dose modification/medication changes as we discussed.   ED Prescriptions     Medication Sig Dispense Auth. Provider   lisinopril (ZESTRIL) 2.5 MG tablet Take 1 tablet (2.5 mg total) by mouth daily. 30 tablet Marisa Cyphers, MD   atorvastatin (LIPITOR) 10 MG tablet Take 1 tablet (10 mg total) by mouth daily. 30 tablet Marisa Cyphers, MD   dapagliflozin propanediol (FARXIGA) 5 MG TABS tablet Take 1 tablet (5 mg total) by mouth daily. 30 tablet Marisa Cyphers, MD   amoxicillin (AMOXIL) 875 MG tablet Take 1 tablet (875 mg total) by mouth 2 (two) times daily for 5 days. 10 tablet Marisa Cyphers, MD      PDMP not reviewed this encounter.   Marisa Cyphers, MD 12/02/23 [phone]

## 2024-09-30 DIAGNOSIS — M545 Low back pain, unspecified: Secondary | ICD-10-CM | POA: Diagnosis not present

## 2024-09-30 DIAGNOSIS — Z7689 Persons encountering health services in other specified circumstances: Secondary | ICD-10-CM | POA: Diagnosis not present

## 2024-09-30 DIAGNOSIS — E119 Type 2 diabetes mellitus without complications: Secondary | ICD-10-CM | POA: Diagnosis not present

## 2024-10-04 ENCOUNTER — Other Ambulatory Visit: Payer: Self-pay

## 2024-10-04 MED ORDER — TRULICITY 0.75 MG/0.5ML ~~LOC~~ SOAJ
0.7500 mg | SUBCUTANEOUS | 0 refills | Status: DC
  Filled 2024-10-04: qty 2, 28d supply, fill #0

## 2024-10-04 MED ORDER — DAPAGLIFLOZIN PROPANEDIOL 5 MG PO TABS
ORAL_TABLET | ORAL | 0 refills | Status: DC
Start: 2024-10-04 — End: 2024-10-04
  Filled 2024-10-04: qty 30, 30d supply, fill #0

## 2024-10-04 MED ORDER — DAPAGLIFLOZIN PROPANEDIOL 10 MG PO TABS
10.0000 mg | ORAL_TABLET | Freq: Every day | ORAL | 1 refills | Status: AC
  Filled 2024-10-04 (×2): qty 30, 30d supply, fill #0
  Filled 2024-10-04: qty 90, 90d supply, fill #0
  Filled 2024-11-01: qty 30, 30d supply, fill #1
  Filled 2024-12-30: qty 30, 30d supply, fill #2

## 2024-10-04 MED ORDER — ATORVASTATIN CALCIUM 10 MG PO TABS
10.0000 mg | ORAL_TABLET | Freq: Every day | ORAL | 0 refills | Status: AC
  Filled 2024-10-04 – 2024-12-30 (×2): qty 90, 90d supply, fill #0

## 2024-10-11 DIAGNOSIS — E1165 Type 2 diabetes mellitus with hyperglycemia: Secondary | ICD-10-CM | POA: Diagnosis not present

## 2024-10-13 DIAGNOSIS — Z0189 Encounter for other specified special examinations: Secondary | ICD-10-CM | POA: Diagnosis not present

## 2024-10-13 DIAGNOSIS — E119 Type 2 diabetes mellitus without complications: Secondary | ICD-10-CM | POA: Diagnosis not present

## 2024-10-13 DIAGNOSIS — Z0000 Encounter for general adult medical examination without abnormal findings: Secondary | ICD-10-CM | POA: Diagnosis not present

## 2024-10-13 DIAGNOSIS — Z1159 Encounter for screening for other viral diseases: Secondary | ICD-10-CM | POA: Diagnosis not present

## 2024-10-17 ENCOUNTER — Other Ambulatory Visit: Payer: Self-pay

## 2024-10-17 MED ORDER — FREESTYLE LIBRE 3 PLUS SENSOR MISC
1 refills | Status: AC
Start: 2024-10-17 — End: ?
  Filled 2024-10-17 – 2024-11-01 (×2): qty 2, 30d supply, fill #0
  Filled 2024-12-30: qty 2, 30d supply, fill #1

## 2024-10-24 ENCOUNTER — Other Ambulatory Visit: Payer: Self-pay

## 2024-10-25 ENCOUNTER — Other Ambulatory Visit: Payer: Self-pay

## 2024-10-26 ENCOUNTER — Other Ambulatory Visit: Payer: Self-pay

## 2024-11-01 ENCOUNTER — Other Ambulatory Visit: Payer: Self-pay

## 2024-11-01 MED ORDER — TRULICITY 0.75 MG/0.5ML ~~LOC~~ SOAJ
0.7500 mg | SUBCUTANEOUS | 0 refills | Status: AC
  Filled 2024-11-01: qty 2, 28d supply, fill #0

## 2024-11-02 ENCOUNTER — Other Ambulatory Visit: Payer: Self-pay

## 2024-12-30 ENCOUNTER — Other Ambulatory Visit: Payer: Self-pay

## 2024-12-30 ENCOUNTER — Other Ambulatory Visit (HOSPITAL_COMMUNITY): Payer: Self-pay
